# Patient Record
Sex: Female | Born: 1980 | Race: White | Hispanic: No | Marital: Married | Smoking: Current some day smoker
Health system: Southern US, Community
[De-identification: ages and names within clinical notes are randomized; demographics above are authoritative.]

## PROBLEM LIST (undated history)

## (undated) ENCOUNTER — Inpatient Hospital Stay (HOSPITAL_COMMUNITY): Payer: Self-pay

## (undated) HISTORY — PX: LEEP: SHX91

## (undated) HISTORY — PX: WISDOM TOOTH EXTRACTION: SHX21

---

## 2009-04-01 ENCOUNTER — Ambulatory Visit: Payer: Self-pay | Admitting: Diagnostic Radiology

## 2009-04-01 ENCOUNTER — Emergency Department (HOSPITAL_BASED_OUTPATIENT_CLINIC_OR_DEPARTMENT_OTHER): Admission: EM | Admit: 2009-04-01 | Discharge: 2009-04-01 | Payer: Self-pay | Admitting: Emergency Medicine

## 2010-09-30 IMAGING — CR DG FINGER LITTLE 2+V*L*
3 series · 3 of 3 positions shown · non-contrast
Comparison: None.

CLINICAL DATA: 28-year-old female status post fall with left little
finger deformity.

LEFT LITTLE FINGER 2+V

[x finger pa left]
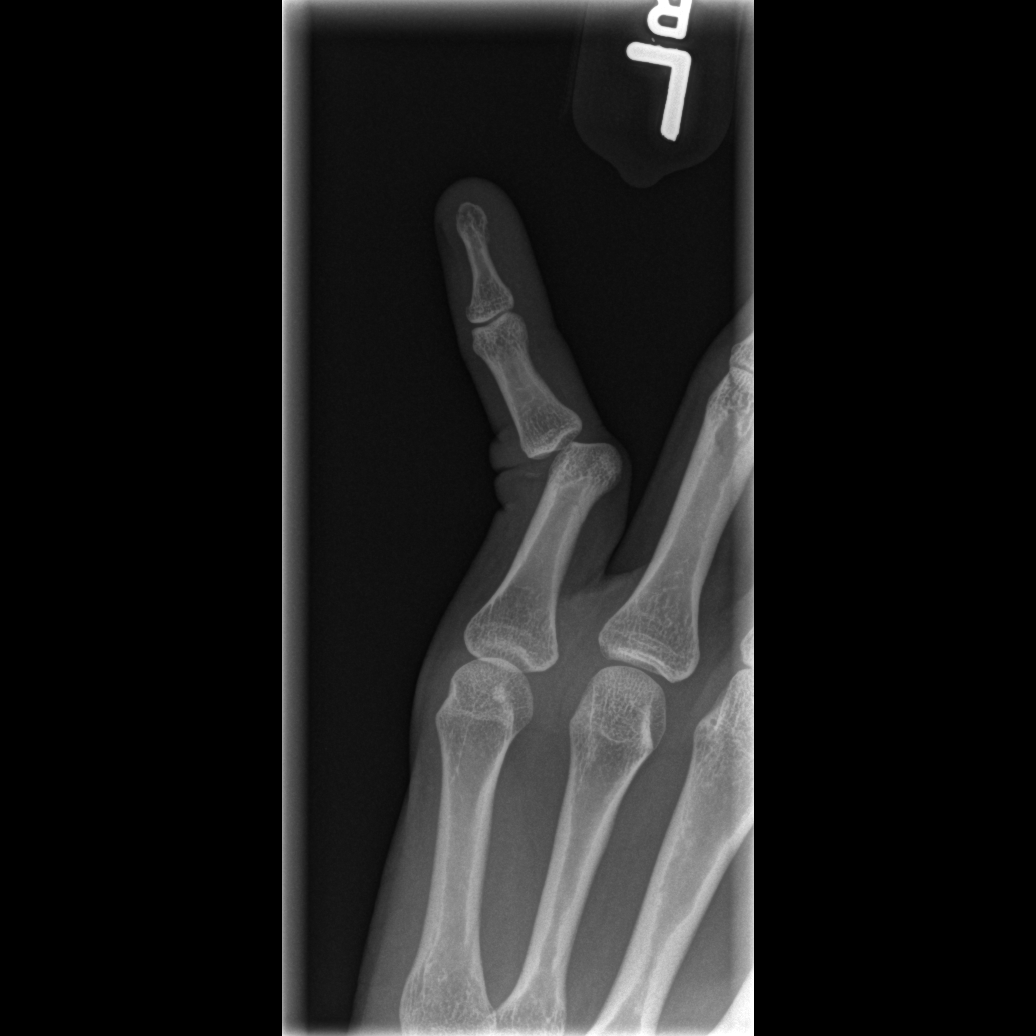

[x finger obl. left]
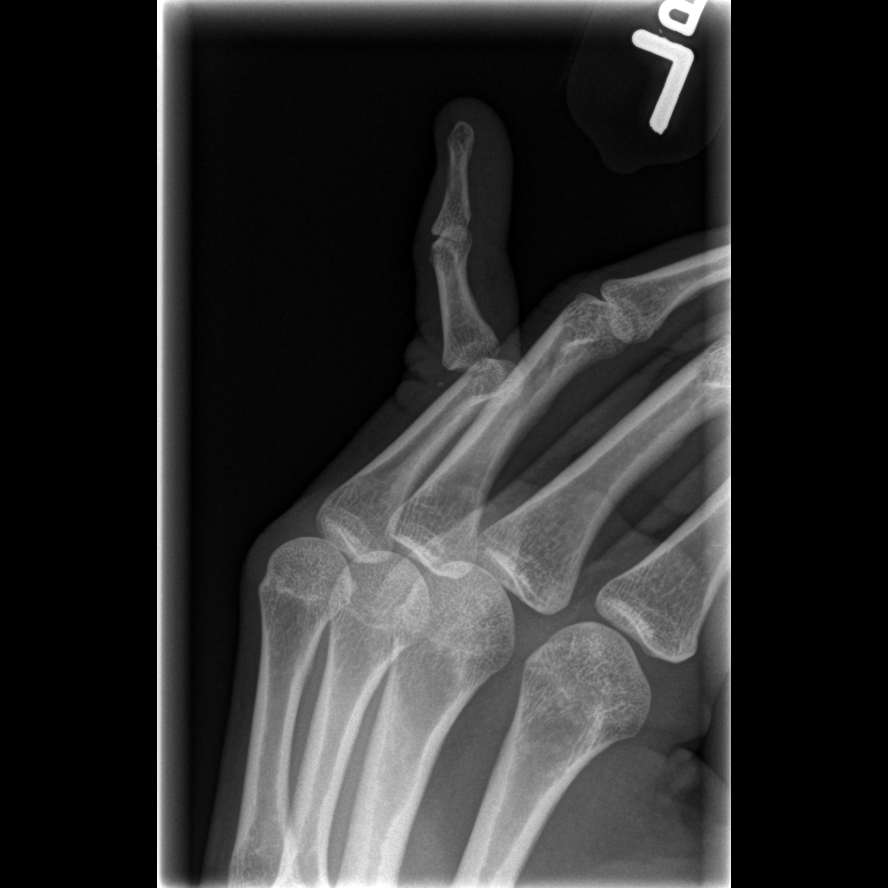

[x finger lateral left]
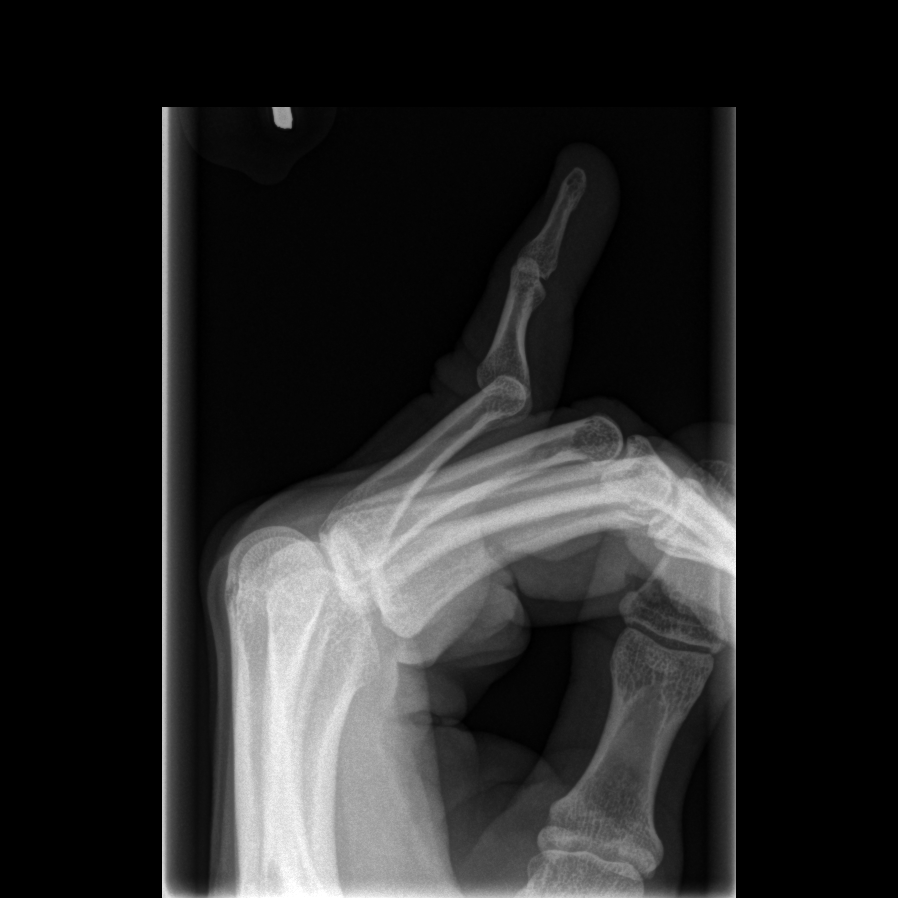

[3 of 3 positions shown; findings below may reference images not displayed]

FINDINGS: The left fifth proximal interphalangeal joint is
medially, and mildly dorsally dislocated.  There is medial
angulation.  There is a punctate hyperdensity just proximal to the
medial base of the middle phalanx which may represent an acute
fracture fragment.  Other visualized osseous structures appear
intact.
IMPRESSION: Medial and dorsal dislocation of the left proximal interphalangeal
joint.  Probable punctate impaction fracture fragment.

## 2010-09-30 IMAGING — CR DG FINGER LITTLE 2+V*L*
3 series · 3 of 3 positions shown · non-contrast
Comparison: Earlier films same day

CLINICAL DATA: Postreduction

LEFT LITTLE FINGER 2+V

[x finger pa left]
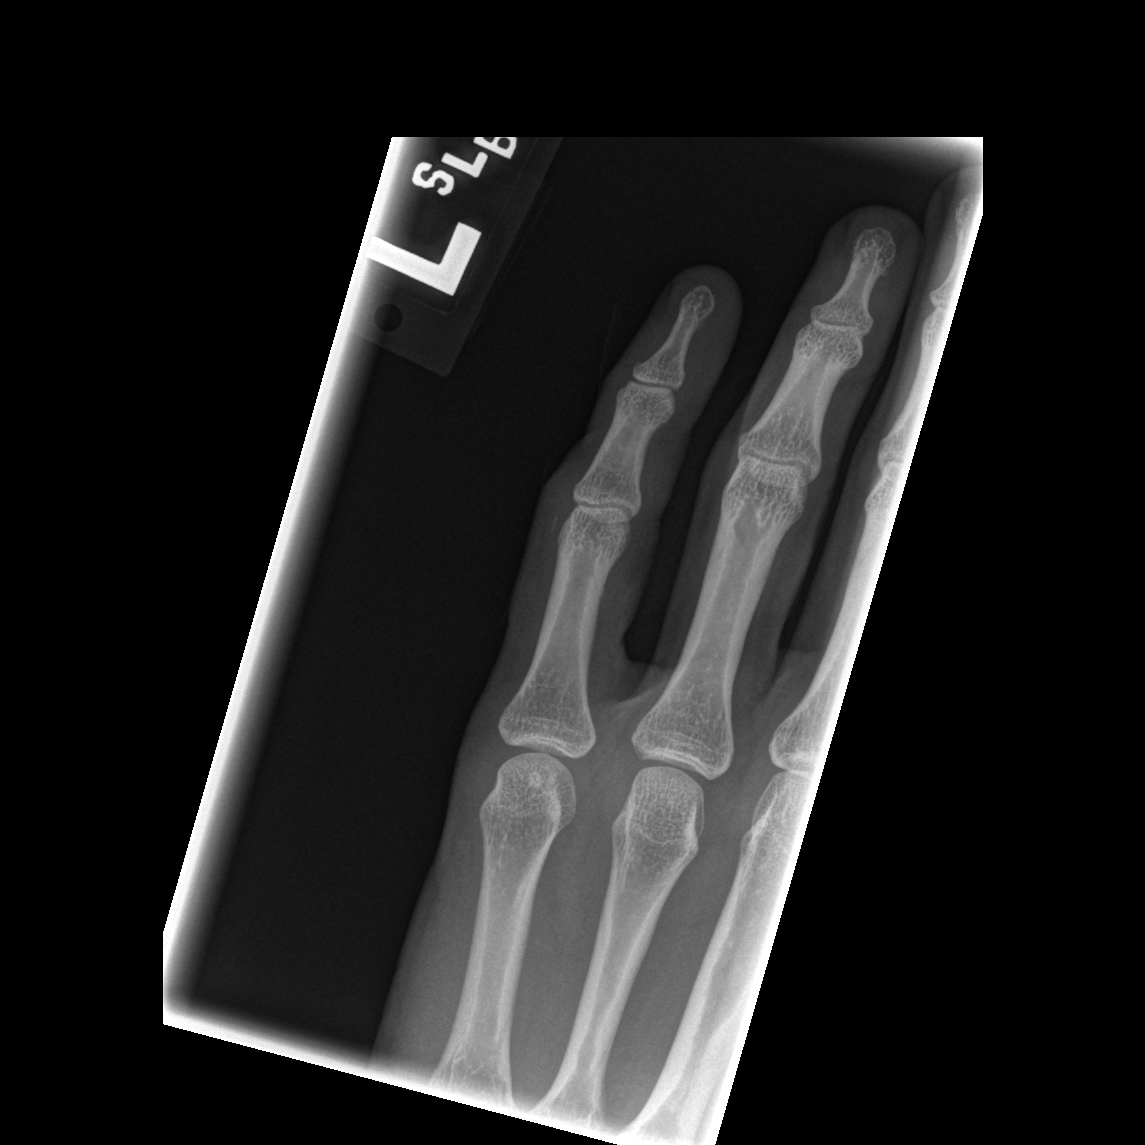

[x finger obl. left]
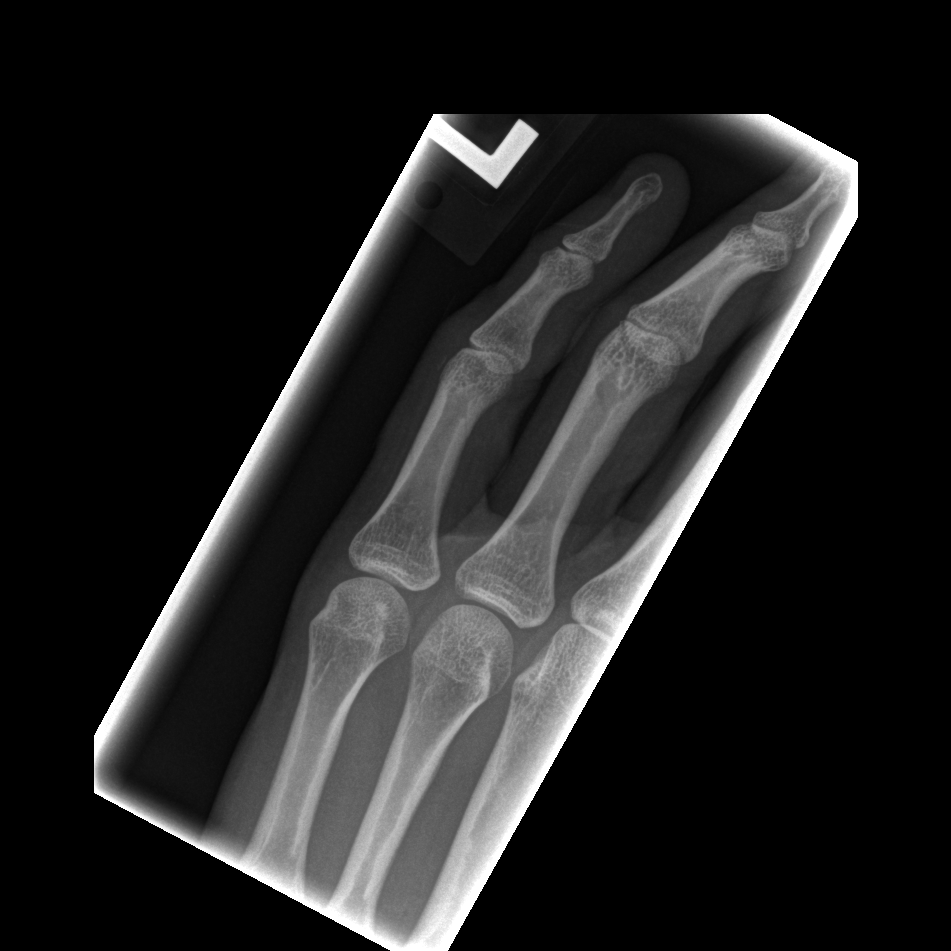

[x finger lateral left]
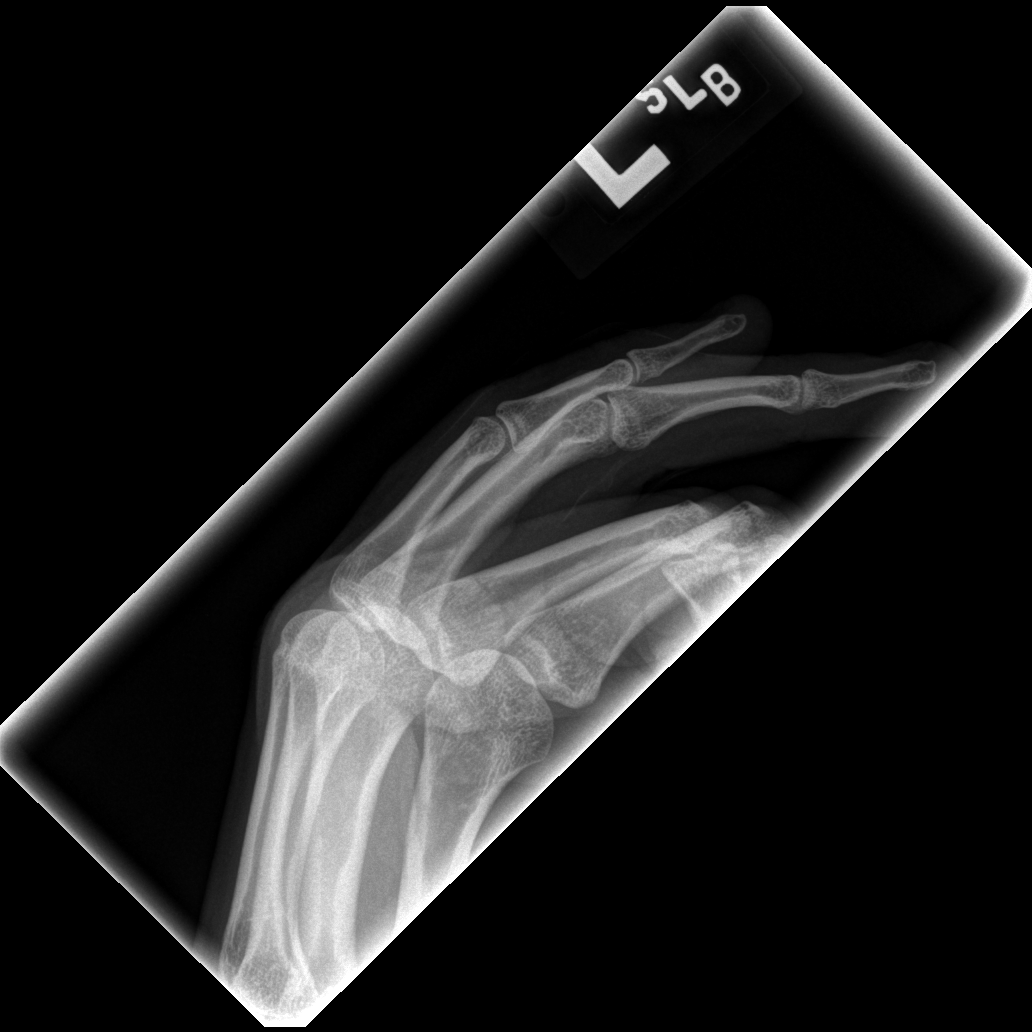

[3 of 3 positions shown; findings below may reference images not displayed]

FINDINGS: Three views submitted for interpretation.  Post reduction
there is anatomic alignment of proximal interphalangeal joint left
fifth finger.  Tiny avulsed bony fragment again noted.
IMPRESSION: Postreduction there is anatomic alignment of the proximal
interphalangeal joint left fifth finger.  Tiny avulsed bony
fragment again noted.

## 2013-05-30 ENCOUNTER — Emergency Department (HOSPITAL_BASED_OUTPATIENT_CLINIC_OR_DEPARTMENT_OTHER)
Admission: EM | Admit: 2013-05-30 | Discharge: 2013-05-30 | Disposition: A | Discharge status: Home / Self Care | Payer: Medicaid Other | Attending: Emergency Medicine | Admitting: Emergency Medicine

## 2013-05-30 ENCOUNTER — Encounter (HOSPITAL_BASED_OUTPATIENT_CLINIC_OR_DEPARTMENT_OTHER): Payer: Self-pay | Admitting: Emergency Medicine

## 2013-05-30 DIAGNOSIS — O209 Hemorrhage in early pregnancy, unspecified: Secondary | ICD-10-CM | POA: Insufficient documentation

## 2013-05-30 DIAGNOSIS — B9689 Other specified bacterial agents as the cause of diseases classified elsewhere: Secondary | ICD-10-CM | POA: Insufficient documentation

## 2013-05-30 DIAGNOSIS — Z3490 Encounter for supervision of normal pregnancy, unspecified, unspecified trimester: Secondary | ICD-10-CM

## 2013-05-30 DIAGNOSIS — Z79899 Other long term (current) drug therapy: Secondary | ICD-10-CM | POA: Insufficient documentation

## 2013-05-30 DIAGNOSIS — O99330 Smoking (tobacco) complicating pregnancy, unspecified trimester: Secondary | ICD-10-CM | POA: Insufficient documentation

## 2013-05-30 DIAGNOSIS — N760 Acute vaginitis: Secondary | ICD-10-CM | POA: Insufficient documentation

## 2013-05-30 DIAGNOSIS — A499 Bacterial infection, unspecified: Secondary | ICD-10-CM | POA: Insufficient documentation

## 2013-05-30 LAB — PREGNANCY, URINE: Preg Test, Ur: POSITIVE — AB

## 2013-05-30 LAB — URINALYSIS, ROUTINE W REFLEX MICROSCOPIC
Bilirubin Urine: NEGATIVE
Glucose, UA: NEGATIVE mg/dL
Ketones, ur: NEGATIVE mg/dL
Protein, ur: NEGATIVE mg/dL
Urobilinogen, UA: 0.2 mg/dL (ref 0.0–1.0)

## 2013-05-30 LAB — URINE MICROSCOPIC-ADD ON

## 2013-05-30 LAB — WET PREP, GENITAL: Yeast Wet Prep HPF POC: NONE SEEN

## 2013-05-30 MED ORDER — METRONIDAZOLE 500 MG PO TABS: 500.0000 mg | ORAL_TABLET | Freq: Three times a day (TID) | ORAL | Status: DC

## 2013-05-30 MED ORDER — GNP PRENATAL VITAMINS 28-0.8 MG PO TABS: 1.0000 | ORAL_TABLET | Freq: Every day | ORAL | Status: DC

## 2013-05-30 NOTE — ED Provider Notes (Signed)
CSN: [id number]     Arrival date & time 05/30/13  1853 History   First MD Initiated Contact with Patient 05/30/13 1922     This chart was scribed for Ethelda Chick, MD by Arlan Organ, ED Scribe. This patient was seen in room MH10/MH10 and the patient's care was started 8:18 PM.   Chief Complaint  Patient presents with  . Vaginal Bleeding   Patient is a 32 y.o. female presenting with vaginal bleeding. The history is provided by the patient. No language interpreter was used.  Vaginal Bleeding Severity:  Mild Onset quality:  Gradual Duration:  2 days Timing:  Intermittent Progression:  Unchanged Chronicity:  New Possible pregnancy: yes   Context: at rest   Relieved by:  None tried Worsened by:  Nothing tried Ineffective treatments:  None tried Associated symptoms: vaginal discharge   Associated symptoms: no fever     HPI Comments: Caitlin Hudson is a 32 y.o. G3P2 female who is [redacted] weeks gestation who presents to the Emergency Department complaining of gradual onset, intermittent vaginal discharge that initially started 2 days ago. Pt describes the discharge as a brown color. She states she recalls 3 occurences of the discharge. She states she noted only pink discharge during her first occurrence of the discharge, and says the last two occurences consisted of both brown and pink discharge. No blood seen.  No abdominal pain.  Pt states she contacted her OB/GYN continuously with no success, and decided to come to ED for evaluation. She denies any other complaints at this time.  [facility], but plans to switch to [facility]  History reviewed. No pertinent past medical history. Past Surgical History  Procedure Laterality Date  . Leep     No family history on file. History  Substance Use Topics  . Smoking status: Current Every Day Smoker  . Smokeless tobacco: Not on file  . Alcohol Use: No   OB History   Grav Para Term Preterm Abortions TAB  SAB Ect Mult Living   1              Review of Systems  Constitutional: Negative for fever and chills.  Genitourinary: Positive for vaginal bleeding and vaginal discharge.  All other systems reviewed and are negative.    Allergies  Review of patient's allergies indicates no known allergies.  Home Medications   Current Outpatient Rx  Name  Route  Sig  Dispense  Refill  . Prenatal Vit-Fe Fumarate-FA (PRENATAL MULTIVITAMIN) TABS tablet   Oral   Take 1 tablet by mouth daily at 12 noon.         . metroNIDAZOLE (FLAGYL) 500 MG tablet   Oral   Take 1 tablet (500 mg total) by mouth 3 (three) times daily.   14 tablet   0   . Prenatal Vit-Fe Fumarate-FA (GNP PRENATAL VITAMINS) 28-0.8 MG TABS   Oral   Take 1 tablet by mouth daily.   30 tablet   0    Triage Vitals: BP 128/68  Pulse 95  Temp(Src) 98.4 F (36.9 C) (Oral)  Resp 18  Ht 5\' 10"  (1.778 m)  Wt 138 lb (62.596 kg)  BMI 19.80 kg/m2  SpO2 100%  LMP 04/07/2013  Physical Exam  Nursing note and vitals reviewed. Constitutional: She is oriented to person, place, and time. She appears well-developed and well-nourished.  HENT:  Head: Normocephalic.  Eyes: EOM are normal.  Neck: Normal range of motion.  Cardiovascular:  Normal rate, regular rhythm and normal heart sounds.   Pulmonary/Chest: Effort normal and breath sounds normal.  Abdominal: Soft. She exhibits no distension. There is no tenderness.  Musculoskeletal: Normal range of motion.  Neurological: She is alert and oriented to person, place, and time.  Psychiatric: She has a normal mood and affect.  Pelvic- scant light brown/yellow discharge in vaginal vault, cervical os closed, no CMT, no adnexal or uterine tenderness  ED Course  Procedures (including critical care time)  DIAGNOSTIC STUDIES: Oxygen Saturation is 100% on RA, Normal by my interpretation.    COORDINATION OF CARE: 8:22 PM- Discussed treatment plan with pt at bedside and pt agreed to plan.      Labs Review Labs Reviewed  URINALYSIS, ROUTINE W REFLEX MICROSCOPIC - Abnormal; Notable for the following:    Hgb urine dipstick TRACE (*)    Leukocytes, UA TRACE (*)    All other components within normal limits  PREGNANCY, URINE - Abnormal; Notable for the following:    Preg Test, Ur POSITIVE (*)    All other components within normal limits  URINE MICROSCOPIC-ADD ON   Imaging Review No results found.  EKG Interpretation   None       MDM   1. Bacterial vaginosis   2. Pregnancy    Pt presents at approx [redacted] weeks gestation with c/o vaginal discharge.  On exam, this discharge is light brown/yellow in color.  No findings c/w vaginal bleeding, no abdominal pain.  Will treat for bacterial vaginosis.  Low suspicion for ectopic pregnancy, advised patient that she will need an ultrasound and given strict return precautions, however no indication for emergent ultrasound tonight.  Discharged with strict return precautions.  Pt agreeable with plan.  I personally performed the services described in this documentation, which was scribed in my presence. The recorded information has been reviewed and is accurate.   Ethelda Chick, MD 05/30/13 [phone]

## 2013-05-30 NOTE — ED Notes (Signed)
Patient is appx [redacted] weeks pregnant and having some pink discharge for the past 2 days.

## 2013-05-31 LAB — GC/CHLAMYDIA PROBE AMP
CT Probe RNA: NEGATIVE
GC Probe RNA: NEGATIVE

## 2013-06-05 ENCOUNTER — Inpatient Hospital Stay (HOSPITAL_COMMUNITY): Payer: Medicaid Other

## 2013-06-05 ENCOUNTER — Encounter (HOSPITAL_COMMUNITY): Payer: Self-pay | Admitting: *Deleted

## 2013-06-05 ENCOUNTER — Inpatient Hospital Stay (HOSPITAL_COMMUNITY)
Admission: AD | Admit: 2013-06-05 | Discharge: 2013-06-06 | Disposition: A | Discharge status: Home / Self Care | Payer: Medicaid Other | Source: Ambulatory Visit | Attending: Obstetrics & Gynecology | Admitting: Obstetrics & Gynecology

## 2013-06-05 DIAGNOSIS — O039 Complete or unspecified spontaneous abortion without complication: Secondary | ICD-10-CM | POA: Insufficient documentation

## 2013-06-05 LAB — CBC
HCT: 32.9 % — ABNORMAL LOW (ref 36.0–46.0)
HEMOGLOBIN: 11.6 g/dL — AB (ref 12.0–15.0)
MCH: 31 pg (ref 26.0–34.0)
MCHC: 35.3 g/dL (ref 30.0–36.0)
MCV: 88 fL (ref 78.0–100.0)
PLATELETS: 220 10*3/uL (ref 150–400)
RBC: 3.74 MIL/uL — AB (ref 3.87–5.11)
RDW: 13.6 % (ref 11.5–15.5)
WBC: 9.4 10*3/uL (ref 4.0–10.5)

## 2013-06-05 NOTE — MAU Provider Note (Signed)
History     CSN: [id number]  Arrival date and time: 06/05/13 2223   First Provider Initiated Contact with Patient 06/05/13 2252      Chief Complaint  Patient presents with  . Vaginal Bleeding   HPI Caitlin Hudson is a 33 y.o. [id number] at [redacted]w[redacted]d who presents to MAU today with complaint of vaginal bleeding. The patient was seen at [facility] and had +UPT in early December. Patient states that bleeding started on 05/31/13. She went to [facility] and was evaluated. Per provider note there was no blood in the vagina at the time. Quant hCG and [place] were not performed. The patient states that the bleeding became progressively heavier since that visit until today when she bled through her clothes. She denies pain, fever, N/V, dizziness, weakness or fatigue. Patient states that this was not a planned pregnancy. She had +UPT at visit for IUD insertion, which was not performed.   OB History   Grav Para Term Preterm Abortions TAB SAB Ect Mult Living   3 2 0 2      2      History reviewed. No pertinent past medical history.  Past Surgical History  Procedure Laterality Date  . Leep    . Wisdom tooth extraction      History reviewed. No pertinent family history.  History  Substance Use Topics  . Smoking status: Current Every Day Smoker  . Smokeless tobacco: Not on file  . Alcohol Use: No    Allergies: No Known Allergies  Prescriptions prior to admission  Medication Sig Dispense Refill  . metroNIDAZOLE (FLAGYL) 500 MG tablet Take 1 tablet (500 mg total) by mouth 3 (three) times daily.  14 tablet  0  . Prenatal Vit-Fe Fumarate-FA (GNP PRENATAL VITAMINS) 28-0.8 MG TABS Take 1 tablet by mouth daily.  30 tablet  0  . Prenatal Vit-Fe Fumarate-FA (PRENATAL MULTIVITAMIN) TABS tablet Take 1 tablet by mouth daily at 12 noon.        Review of Systems  Constitutional: Negative for fever and malaise/fatigue.  Gastrointestinal: Negative for nausea, vomiting, abdominal pain, diarrhea and constipation.   Genitourinary: Negative for dysuria, urgency and frequency.       + vaginal bleeding Neg - vaginal discharge  Neurological: Negative for dizziness, loss of consciousness and weakness.   Physical Exam   Blood pressure 115/82, pulse 86, temperature 99.3 F (37.4 C), temperature source Oral, resp. rate 18, height 5\' 10"  (1.778 m), weight 138 lb (62.596 kg), last menstrual period 04/07/2013, SpO2 100.00%.  Physical Exam  Constitutional: She is oriented to person, place, and time. She appears well-developed and well-nourished. No distress.  HENT:  Head: Normocephalic and atraumatic.  Cardiovascular: Normal rate.   Respiratory: Effort normal.  GI: Soft. She exhibits no distension and no mass. There is no tenderness. There is no rebound and no guarding.  Genitourinary: Uterus is enlarged (slightly). Uterus is not tender. Cervix exhibits no motion tenderness, no discharge and no friability. Right adnexum displays no mass and no tenderness. Left adnexum displays no mass and no tenderness. There is bleeding (large amount of blood and clots in the vagina) around the vagina. No vaginal discharge found.  Neurological: She is alert and oriented to person, place, and time.  Skin: Skin is warm and dry. No erythema.  Psychiatric: She has a normal mood and affect.  Cervix: closed, long, thick  Results for orders placed during the hospital encounter of 06/05/13 (from the past 24 hour(s))  CBC  Status: Abnormal   Collection Time    06/05/13 11:27 PM      Result Value Range   WBC 9.4  4.0 - 10.5 K/uL   RBC 3.74 (*) 3.87 - 5.11 MIL/uL   Hemoglobin 11.6 (*) 12.0 - 15.0 g/dL   HCT 16.132.9 (*) 09.636.0 - 04.546.0 %   MCV 88.0  78.0 - 100.0 fL   MCH 31.0  26.0 - 34.0 pg   MCHC 35.3  30.0 - 36.0 g/dL   RDW 40.913.6  81.111.5 - 91.415.5 %   Platelets 220  150 - 400 K/uL  ABO/RH     Status: None   Collection Time    06/05/13 11:27 PM      Result Value Range   ABO/RH(D) A POS    HCG, QUANTITATIVE, PREGNANCY     Status:  Abnormal   Collection Time    06/05/13 11:27 PM      Result Value Range   hCG, Beta Chain, Quant, S 5317 (*) <5 mIU/mL   [place] Ob Comp Less 14 Wks  06/06/2013   CLINICAL DATA:  Vaginal bleeding  EXAM: OBSTETRIC <14 WK US AND TRANSVAGINAL OB US  TECHNIQUE: Both transabdominal and transvaginal ultrasound examinations were performed for complete evaluation of the gestation as well as the maternal uterus, adnexal regions, and pelvic cul-de-sac. Transvaginal technique was performed to assess early pregnancy.  COMPARISON:  None.  FINDINGS: There is a sac-like structure at the level of the cervix which has a thick echogenic rim. There may be a yolk sac within the structure. No fetal pole is seen. The mean sac diameter would measure 1.1 cm, an estimated gestational age of [redacted] weeks 6 days. No normal intrauterine gestational sac evident. The ovaries are symmetric in volume. No adnexal mass. No free pelvic fluid.  IMPRESSION: Probable gestational sac located at the level of the cervix. This is most likely abortion in progress, but followup beta HCG and/or sonography is recommended to ensure findings do not represent cervical implantation.   Electronically Signed   By: Tiburcio PeaJonathan  Watts M.D.   On: 06/06/2013 00:28   [place] Ob Transvaginal  06/06/2013   CLINICAL DATA:  Vaginal bleeding  EXAM: OBSTETRIC <14 WK US AND TRANSVAGINAL OB US  TECHNIQUE: Both transabdominal and transvaginal ultrasound examinations were performed for complete evaluation of the gestation as well as the maternal uterus, adnexal regions, and pelvic cul-de-sac. Transvaginal technique was performed to assess early pregnancy.  COMPARISON:  None.  FINDINGS: There is a sac-like structure at the level of the cervix which has a thick echogenic rim. There may be a yolk sac within the structure. No fetal pole is seen. The mean sac diameter would measure 1.1 cm, an estimated gestational age of [redacted] weeks 6 days. No normal intrauterine gestational sac evident. The ovaries  are symmetric in volume. No adnexal mass. No free pelvic fluid.  IMPRESSION: Probable gestational sac located at the level of the cervix. This is most likely abortion in progress, but followup beta HCG and/or sonography is recommended to ensure findings do not represent cervical implantation.   Electronically Signed   By: Tiburcio PeaJonathan  Watts M.D.   On: 06/06/2013 00:28   Re-examined patient after US results stated IUGS in cervix. Small amount of material resembling POC was removed. Bleeding stabilized. Patient continues to deny pain.   MAU Course  Procedures None  MDM CBC, quant hCG, ABO/Rh and US today  Assessment and Plan  A: SAB  P: Discharge home Bleeding precautions discussed  Patient declines additional pain medications. Recommended Ibuprofen and Tylenol PRN pain Patient referred to GYN clinic at [facility]. They will call her with an appointment Patient may return to MAU as needed or if her condition were to change or worsen  Freddi Starr, PA-C  06/06/2013, 12:44 AM

## 2013-06-05 NOTE — MAU Note (Signed)
Patient presents to MAU with c/o of bright red bleeding; noticed clots and "tissue like" material. Denies dizziness at this time.

## 2013-06-06 LAB — HCG, QUANTITATIVE, PREGNANCY: HCG, BETA CHAIN, QUANT, S: 5317 m[IU]/mL — AB (ref <5)

## 2013-06-06 LAB — ABO/RH: ABO/RH(D): A POS

## 2013-06-06 NOTE — Discharge Instructions (Signed)
Incomplete Miscarriage  Miscarriages in pregnancy are common. A miscarriage is a pregnancy that has ended before the twentieth week. You have had an incomplete miscarriage. Partial parts of the fetus or placenta (afterbirth) remain behind. Sometimes further treatment is needed. The most common reason for further treatment is continued bleeding (hemorrhage). Tissue left behind may also become infected. Treatment usually is curettage. Curettage for an incomplete abortion is a procedure in which the remaining products of pregnancy are removed. This can be done by a simple sucking procedure (suction curettage). It can also be done by a simple scraping (curettage) of the inside of the uterus (womb). This may be done in the hospital or in the caregiver's office. This is only done when your caregiver knows the pregnancy has ended. This is determined by physical examination and a negative pregnancy test. It may also include an ultrasound to confirm a dead fetus. The ultrasound may also prove that products of the pregnancy remain in the uterus.  If your cervix remains dilated and you are still passing clots and tissue, your caregiver may wish to watch you for a little while. Your caregiver may want to see if you are going to finish passing all of the remaining parts of the pregnancy. If the bleeding continues, they may proceed with curettage.  WHY DO I FEEL THIS WAY  Miscarriages can be a very emotional time for prospective mothers. This is not you or your partner's fault. The miscarriage did not occur because of a lack in you or your partner. Nearly all miscarriages occur because the pregnancy has started off wrongly. At least half of miscarried pregnancies have a chromosomal abnormality (almost always not inherited). Others may have developmental problems with the fetus or placentas. Problems may not show up even when the products miscarried are studied under the microscope. You can usually begin trying for another  pregnancy as soon as your caregiver says it's okay.  HOME CARE INSTRUCTIONS    Your caregiver may order bed rest (this means only getting up to use the bathroom). Your caregiver may allow you to continue light activity. If curettage was not done at this time, but you require further treatment.   Keep track of the number of pads you use each day. Keep track of how saturated (soaked) they are. Record this information.   Do not use tampons. Do not douche or have sexual intercourse until approved by your caregiver.   It is very important to keep all follow-up appointments for re-evaluation and continuing management.   Women who have an Rh negative blood type (ie, A, B, AB, or O negative) need to receive a drug called Rh(D) immune globulin. This medicine helps protect future fetuses against problems that can occur if an Rh negative mother is carrying a baby who is Rh positive.  SEEK IMMEDIATE MEDICAL CARE IF:    You experience severe cramps in your stomach, back, or abdomen.   You run an unexplained temperature (record these).   You pass large clots or tissue (save any tissue for your caregiver to inspect).   Your bleeding increases or you become light-headed, weak, or have fainting episodes.  MAKE SURE YOU:    Understand these instructions.   Will watch your condition.   Will get help right away if you are not doing well or get worse.  Document Released: 05/18/2005 Document Revised: 08/10/2011 Document Reviewed: 01/06/2008  [employer] Patient Information 2014 [employer].

## 2013-06-21 ENCOUNTER — Inpatient Hospital Stay (HOSPITAL_COMMUNITY)
Admission: AD | Admit: 2013-06-21 | Discharge: 2013-06-21 | Disposition: A | Discharge status: Home / Self Care | Payer: Medicaid Other | Source: Ambulatory Visit | Attending: Obstetrics & Gynecology | Admitting: Obstetrics & Gynecology

## 2013-06-21 ENCOUNTER — Encounter (HOSPITAL_COMMUNITY): Payer: Self-pay | Admitting: *Deleted

## 2013-06-21 DIAGNOSIS — O039 Complete or unspecified spontaneous abortion without complication: Secondary | ICD-10-CM | POA: Insufficient documentation

## 2013-06-21 LAB — URINALYSIS, ROUTINE W REFLEX MICROSCOPIC
BILIRUBIN URINE: NEGATIVE
Glucose, UA: NEGATIVE mg/dL
KETONES UR: NEGATIVE mg/dL
LEUKOCYTES UA: NEGATIVE
NITRITE: NEGATIVE
PH: 6.5 (ref 5.0–8.0)
PROTEIN: NEGATIVE mg/dL
Specific Gravity, Urine: 1.01 (ref 1.005–1.030)
UROBILINOGEN UA: 0.2 mg/dL (ref 0.0–1.0)

## 2013-06-21 LAB — URINE MICROSCOPIC-ADD ON

## 2013-06-21 LAB — CBC
HCT: 33.4 % — ABNORMAL LOW (ref 36.0–46.0)
Hemoglobin: 11.4 g/dL — ABNORMAL LOW (ref 12.0–15.0)
MCH: 30.7 pg (ref 26.0–34.0)
MCHC: 34.1 g/dL (ref 30.0–36.0)
MCV: 90 fL (ref 78.0–100.0)
PLATELETS: 262 10*3/uL (ref 150–400)
RBC: 3.71 MIL/uL — AB (ref 3.87–5.11)
RDW: 13.6 % (ref 11.5–15.5)
WBC: 8 10*3/uL (ref 4.0–10.5)

## 2013-06-21 LAB — HCG, QUANTITATIVE, PREGNANCY: hCG, Beta Chain, Quant, S: 794 m[IU]/mL — ABNORMAL HIGH (ref <5)

## 2013-06-21 NOTE — Discharge Instructions (Signed)
Miscarriage A miscarriage is the sudden loss of an unborn baby (fetus) before the 20th week of pregnancy. Most miscarriages happen in the first 3 months of pregnancy. Sometimes, it happens before a woman even knows she is pregnant. A miscarriage is also called a "spontaneous miscarriage" or "early pregnancy loss." Having a miscarriage can be an emotional experience. Talk with your caregiver about any questions you may have about miscarrying, the grieving process, and your future pregnancy plans. CAUSES   Problems with the fetal chromosomes that make it impossible for the baby to develop normally. Problems with the baby's genes or chromosomes are most often the result of errors that occur, by chance, as the embryo divides and grows. The problems are not inherited from the parents.  Infection of the cervix or uterus.   Hormone problems.   Problems with the cervix, such as having an incompetent cervix. This is when the tissue in the cervix is not strong enough to hold the pregnancy.   Problems with the uterus, such as an abnormally shaped uterus, uterine fibroids, or congenital abnormalities.   Certain medical conditions.   Smoking, drinking alcohol, or taking illegal drugs.   Trauma.  Often, the cause of a miscarriage is unknown.  SYMPTOMS   Vaginal bleeding or spotting, with or without cramps or pain.  Pain or cramping in the abdomen or lower back.  Passing fluid, tissue, or blood clots from the vagina. DIAGNOSIS  Your caregiver will perform a physical exam. You may also have an ultrasound to confirm the miscarriage. Blood or urine tests may also be ordered. TREATMENT   Sometimes, treatment is not necessary if you naturally pass all the fetal tissue that was in the uterus. If some of the fetus or placenta remains in the body (incomplete miscarriage), tissue left behind may become infected and must be removed. Usually, a dilation and curettage (D and C) procedure is performed.  During a D and C procedure, the cervix is widened (dilated) and any remaining fetal or placental tissue is gently removed from the uterus.  Antibiotic medicines are prescribed if there is an infection. Other medicines may be given to reduce the size of the uterus (contract) if there is a lot of bleeding.  If you have Rh negative blood and your baby was Rh positive, you will need a Rh immunoglobulin shot. This shot will protect any future baby from having Rh blood problems in future pregnancies. HOME CARE INSTRUCTIONS   Your caregiver may order bed rest or may allow you to continue light activity. Resume activity as directed by your caregiver.  Have someone help with home and family responsibilities during this time.   Keep track of the number of sanitary pads you use each day and how soaked (saturated) they are. Write down this information.   Do not use tampons. Do not douche or have sexual intercourse until approved by your caregiver.   Only take over-the-counter or prescription medicines for pain or discomfort as directed by your caregiver.   Do not take aspirin. Aspirin can cause bleeding.   Keep all follow-up appointments with your caregiver.   If you or your partner have problems with grieving, talk to your caregiver or seek counseling to help cope with the pregnancy loss. Allow enough time to grieve before trying to get pregnant again.  SEEK IMMEDIATE MEDICAL CARE IF:   You have severe cramps or pain in your back or abdomen.  You have a fever.  You pass large blood clots (walnut-sized   or larger) ortissue from your vagina. Save any tissue for your caregiver to inspect.   Your bleeding increases.   You have a thick, bad-smelling vaginal discharge.  You become lightheaded, weak, or you faint.   You have chills.  MAKE SURE YOU:  Understand these instructions.  Will watch your condition.  Will get help right away if you are not doing well or get  worse. Document Released: 11/11/2000 Document Revised: 09/12/2012 Document Reviewed: 07/07/2011 [employer] Patient Information 2014 [employer].  

## 2013-06-21 NOTE — MAU Provider Note (Signed)
Chief Complaint: Vaginal Bleeding   First Provider Initiated Contact with Patient 06/21/13 1837     SUBJECTIVE HPI: Caitlin Hudson is a 33 y.o. [id number] at [redacted]w[redacted]d by LMP diagnosed with SAB on 06/05/13 who presents to maternity admissions reporting spotting daily since her diagnosis, then episode of heavy vaginal bleeding with large clots while standing in line at [place] today.  She was diagnosed with SAB with ultrasound showing likely POCs at cervical os.  She was discharged with incomplete miscarriage with bleeding precautions.  She denies abdominal pain, exposure to STDs, vaginal itching/burning, urinary symptoms, h/a, dizziness, n/v, or fever/chills.    No past medical history on file. Past Surgical History  Procedure Laterality Date  . Leep    . Wisdom tooth extraction     History   Social History  . Marital Status: Married    Spouse Name: N/A    Number of Children: N/A  . Years of Education: N/A   Occupational History  . Not on file.   Social History Main Topics  . Smoking status: Current Every Day Smoker  . Smokeless tobacco: Not on file  . Alcohol Use: No  . Drug Use: No  . Sexual Activity: Yes   Other Topics Concern  . Not on file   Social History Narrative  . No narrative on file   No current facility-administered medications on file prior to encounter.   Current Outpatient Prescriptions on File Prior to Encounter  Medication Sig Dispense Refill  . metroNIDAZOLE (FLAGYL) 500 MG tablet Take 1 tablet (500 mg total) by mouth 3 (three) times daily.  14 tablet  0  . Prenatal Vit-Fe Fumarate-FA (GNP PRENATAL VITAMINS) 28-0.8 MG TABS Take 1 tablet by mouth daily.  30 tablet  0  . Prenatal Vit-Fe Fumarate-FA (PRENATAL MULTIVITAMIN) TABS tablet Take 1 tablet by mouth daily at 12 noon.       No Known Allergies  ROS: Pertinent items in HPI  OBJECTIVE Blood pressure 134/69, pulse 89, temperature 98.2 F (36.8 C), temperature source Oral, resp. rate 18, height 5\' 10"  (1.778  m), weight 62.596 kg (138 lb), last menstrual period 04/07/2013, unknown if currently breastfeeding. GENERAL: Well-developed, well-nourished female in no acute distress.  HEENT: Normocephalic HEART: normal rate RESP: normal effort ABDOMEN: Soft, non-tender EXTREMITIES: Nontender, no edema NEURO: Alert and oriented Pelvic exam: Cervix pink, visually 1-2cm dilated with large grey mass protruding from os, moderate amount dark red blood noted, vaginal walls and external genitalia normal Attempt to remove mass/possible POCs with Allis clamp unsuccessful.  Bimanual exam: Cervix 2 cm external os with palpable mass protruding from os, uterus nontender, nonenlarged, adnexa without tenderness, enlargement, or mass  Following bimanual exam, second speculum exam performed and likely POCs removed without difficulty from os using ring forceps.  Sent to pathology for confirmation.  LAB RESULTS Results for orders placed during the hospital encounter of 06/21/13 (from the past 24 hour(s))  URINALYSIS, ROUTINE W REFLEX MICROSCOPIC     Status: Abnormal   Collection Time    06/21/13  5:30 PM      Result Value Range   Color, Urine STRAW (*) YELLOW   APPearance CLEAR  CLEAR   Specific Gravity, Urine 1.010  1.005 - 1.030   pH 6.5  5.0 - 8.0   Glucose, UA NEGATIVE  NEGATIVE mg/dL   Hgb urine dipstick LARGE (*) NEGATIVE   Bilirubin Urine NEGATIVE  NEGATIVE   Ketones, ur NEGATIVE  NEGATIVE mg/dL   Protein, ur NEGATIVE  NEGATIVE  mg/dL   Urobilinogen, UA 0.2  0.0 - 1.0 mg/dL   Nitrite NEGATIVE  NEGATIVE   Leukocytes, UA NEGATIVE  NEGATIVE  URINE MICROSCOPIC-ADD ON     Status: None   Collection Time    06/21/13  5:30 PM      Result Value Range   Squamous Epithelial / LPF RARE  RARE   WBC, UA 0-2  <3 WBC/hpf   RBC / HPF 11-20  <3 RBC/hpf   Bacteria, UA RARE  RARE  HCG, QUANTITATIVE, PREGNANCY     Status: Abnormal   Collection Time    06/21/13  5:41 PM      Result Value Range   hCG, Beta Chain, Quant,  S 794 (*) <5 mIU/mL  CBC     Status: Abnormal   Collection Time    06/21/13  5:41 PM      Result Value Range   WBC 8.0  4.0 - 10.5 K/uL   RBC 3.71 (*) 3.87 - 5.11 MIL/uL   Hemoglobin 11.4 (*) 12.0 - 15.0 g/dL   HCT 40.933.4 (*) 81.136.0 - 91.446.0 %   MCV 90.0  78.0 - 100.0 fL   MCH 30.7  26.0 - 34.0 pg   MCHC 34.1  30.0 - 36.0 g/dL   RDW 78.213.6  95.611.5 - 21.315.5 %   Platelets 262  150 - 400 K/uL    ASSESSMENT 1. SAB (spontaneous abortion)     PLAN Discharge home Recommend multivitamin/prenatal with iron, iron-rich diet F/U in clinic in 1 week.  Pt has appt tomorrow, message sent to reschedule Return to MAU as needed    Medication List         GNP PRENATAL VITAMINS 28-0.8 MG Tabs  Take 1 tablet by mouth daily.     metroNIDAZOLE 500 MG tablet  Commonly known as:  FLAGYL  Take 1 tablet (500 mg total) by mouth 3 (three) times daily.     prenatal multivitamin Tabs tablet  Take 1 tablet by mouth daily at 12 noon.         Sharen CounterLisa Leftwich-Kirby Certified Nurse-Midwife 06/21/2013  6:40 PM

## 2013-06-21 NOTE — MAU Note (Signed)
Pt presents with complaints of bright red vaginal bleeding and she had a miscarriage January the 5th. Follow appointment is tomorrow for repeat labs

## 2013-06-22 ENCOUNTER — Encounter: Payer: Self-pay | Admitting: Obstetrics & Gynecology

## 2013-07-03 ENCOUNTER — Ambulatory Visit (INDEPENDENT_AMBULATORY_CARE_PROVIDER_SITE_OTHER): Payer: Medicaid Other | Admitting: Obstetrics & Gynecology

## 2013-07-03 ENCOUNTER — Encounter: Payer: Self-pay | Admitting: Obstetrics & Gynecology

## 2013-07-03 VITALS — BP 110/68 | HR 82 | Temp 97.2°F | Ht 70.0 in | Wt 142.2 lb

## 2013-07-03 DIAGNOSIS — O039 Complete or unspecified spontaneous abortion without complication: Secondary | ICD-10-CM

## 2013-07-03 NOTE — Progress Notes (Signed)
   Subjective:    Patient ID: Caitlin Hudson, female    DOB: 02-10-1981, 33 y.o.   MRN: [id number]  HPI  Ms. Skowronek is now about 2 1/2 weeks s/p complete ab. She was seen in the MAU for this issue. She has no complaints, she has quit bleeding, she plans on getting Mirena at the [place] office next month. She has not had sex yet.  Review of Systems     Objective:   Physical Exam        Assessment & Plan:  Miscarriage, complete- I will check a QBHCG today. RTC for Mirena

## 2013-07-03 NOTE — Patient Instructions (Signed)
Miscarriage A miscarriage is the sudden loss of an unborn baby (fetus) before the 20th week of pregnancy. Most miscarriages happen in the first 3 months of pregnancy. Sometimes, it happens before a woman even knows she is pregnant. A miscarriage is also called a "spontaneous miscarriage" or "early pregnancy loss." Having a miscarriage can be an emotional experience. Talk with your caregiver about any questions you may have about miscarrying, the grieving process, and your future pregnancy plans. CAUSES   Problems with the fetal chromosomes that make it impossible for the baby to develop normally. Problems with the baby's genes or chromosomes are most often the result of errors that occur, by chance, as the embryo divides and grows. The problems are not inherited from the parents.  Infection of the cervix or uterus.   Hormone problems.   Problems with the cervix, such as having an incompetent cervix. This is when the tissue in the cervix is not strong enough to hold the pregnancy.   Problems with the uterus, such as an abnormally shaped uterus, uterine fibroids, or congenital abnormalities.   Certain medical conditions.   Smoking, drinking alcohol, or taking illegal drugs.   Trauma.  Often, the cause of a miscarriage is unknown.  SYMPTOMS   Vaginal bleeding or spotting, with or without cramps or pain.  Pain or cramping in the abdomen or lower back.  Passing fluid, tissue, or blood clots from the vagina. DIAGNOSIS  Your caregiver will perform a physical exam. You may also have an ultrasound to confirm the miscarriage. Blood or urine tests may also be ordered. TREATMENT   Sometimes, treatment is not necessary if you naturally pass all the fetal tissue that was in the uterus. If some of the fetus or placenta remains in the body (incomplete miscarriage), tissue left behind may become infected and must be removed. Usually, a dilation and curettage (D and C) procedure is performed.  During a D and C procedure, the cervix is widened (dilated) and any remaining fetal or placental tissue is gently removed from the uterus.  Antibiotic medicines are prescribed if there is an infection. Other medicines may be given to reduce the size of the uterus (contract) if there is a lot of bleeding.  If you have Rh negative blood and your baby was Rh positive, you will need a Rh immunoglobulin shot. This shot will protect any future baby from having Rh blood problems in future pregnancies. HOME CARE INSTRUCTIONS   Your caregiver may order bed rest or may allow you to continue light activity. Resume activity as directed by your caregiver.  Have someone help with home and family responsibilities during this time.   Keep track of the number of sanitary pads you use each day and how soaked (saturated) they are. Write down this information.   Do not use tampons. Do not douche or have sexual intercourse until approved by your caregiver.   Only take over-the-counter or prescription medicines for pain or discomfort as directed by your caregiver.   Do not take aspirin. Aspirin can cause bleeding.   Keep all follow-up appointments with your caregiver.   If you or your partner have problems with grieving, talk to your caregiver or seek counseling to help cope with the pregnancy loss. Allow enough time to grieve before trying to get pregnant again.  SEEK IMMEDIATE MEDICAL CARE IF:   You have severe cramps or pain in your back or abdomen.  You have a fever.  You pass large blood clots (walnut-sized   or larger) ortissue from your vagina. Save any tissue for your caregiver to inspect.   Your bleeding increases.   You have a thick, bad-smelling vaginal discharge.  You become lightheaded, weak, or you faint.   You have chills.  MAKE SURE YOU:  Understand these instructions.  Will watch your condition.  Will get help right away if you are not doing well or get  worse. Document Released: 11/11/2000 Document Revised: 09/12/2012 Document Reviewed: 07/07/2011 [employer] Patient Information 2014 [employer]  

## 2013-07-04 LAB — HCG, QUANTITATIVE, PREGNANCY: hCG, Beta Chain, Quant, S: 10.8 m[IU]/mL

## 2014-04-02 ENCOUNTER — Encounter: Payer: Self-pay | Admitting: Obstetrics & Gynecology
